# Patient Record
Sex: Male | Born: 1935 | Marital: Single
Health system: Southern US, Community
[De-identification: ages and names within clinical notes are randomized; demographics above are authoritative.]

---

## 2004-11-28 ENCOUNTER — Ambulatory Visit: Payer: Self-pay

## 2004-11-30 ENCOUNTER — Emergency Department: Payer: Self-pay | Admitting: Emergency Medicine

## 2005-02-17 ENCOUNTER — Ambulatory Visit: Payer: Self-pay

## 2005-07-11 ENCOUNTER — Ambulatory Visit: Payer: Self-pay | Admitting: Internal Medicine

## 2005-07-31 ENCOUNTER — Ambulatory Visit: Payer: Self-pay | Admitting: Internal Medicine

## 2006-05-21 ENCOUNTER — Ambulatory Visit: Payer: Self-pay | Admitting: Internal Medicine

## 2006-05-29 ENCOUNTER — Ambulatory Visit: Payer: Self-pay | Admitting: Cardiology

## 2006-06-03 ENCOUNTER — Other Ambulatory Visit: Payer: Self-pay

## 2006-06-03 ENCOUNTER — Inpatient Hospital Stay: Payer: Self-pay | Admitting: Cardiology

## 2006-06-04 ENCOUNTER — Other Ambulatory Visit: Payer: Self-pay

## 2007-01-06 ENCOUNTER — Ambulatory Visit: Payer: Self-pay | Admitting: Specialist

## 2007-06-03 ENCOUNTER — Inpatient Hospital Stay: Payer: Self-pay | Admitting: Internal Medicine

## 2009-06-13 ENCOUNTER — Inpatient Hospital Stay: Payer: Self-pay | Admitting: Vascular Surgery

## 2010-01-01 IMAGING — CR DG CHEST 2V
1 series · 3 of 3 positions shown · non-contrast
Comparison: none

REASON FOR EXAM: basal infitrate
COMMENTS:

[Series 1: view not recorded · 0.17mm/px · 3 of 3 slices shown]
[im 1/3]
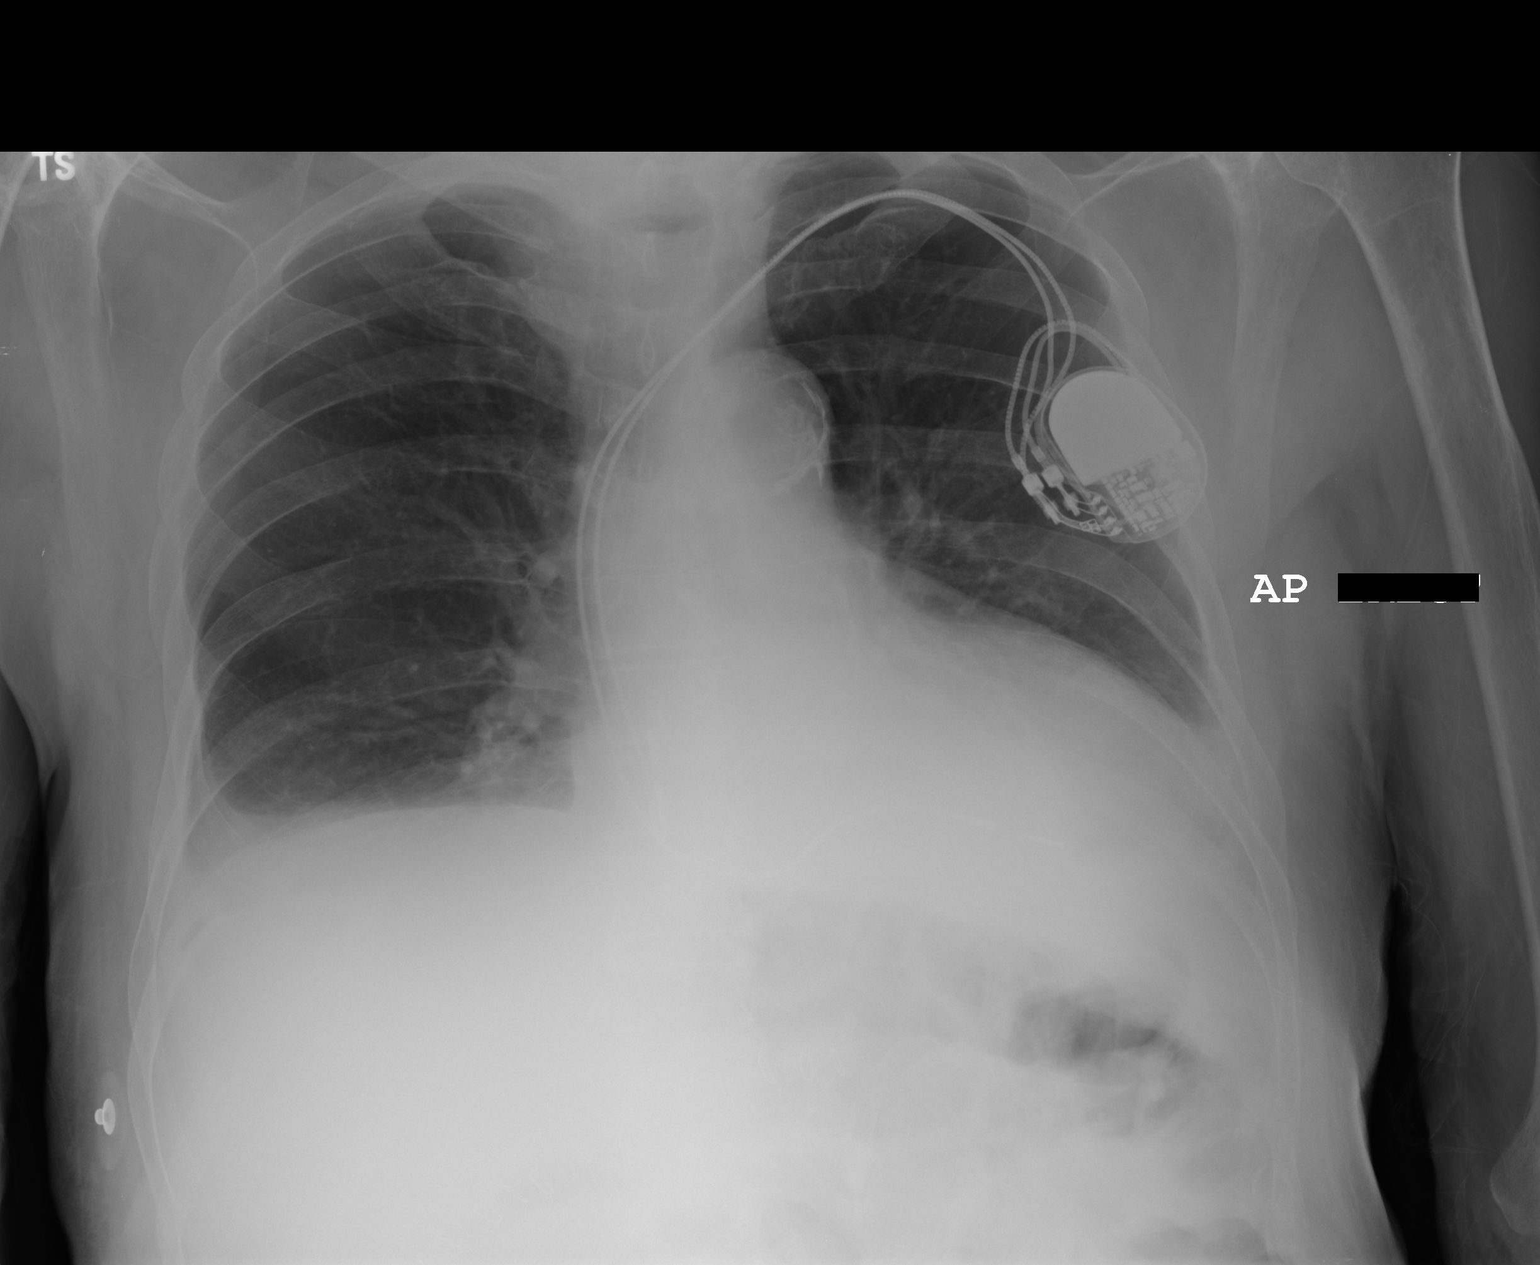
[im 2/3]
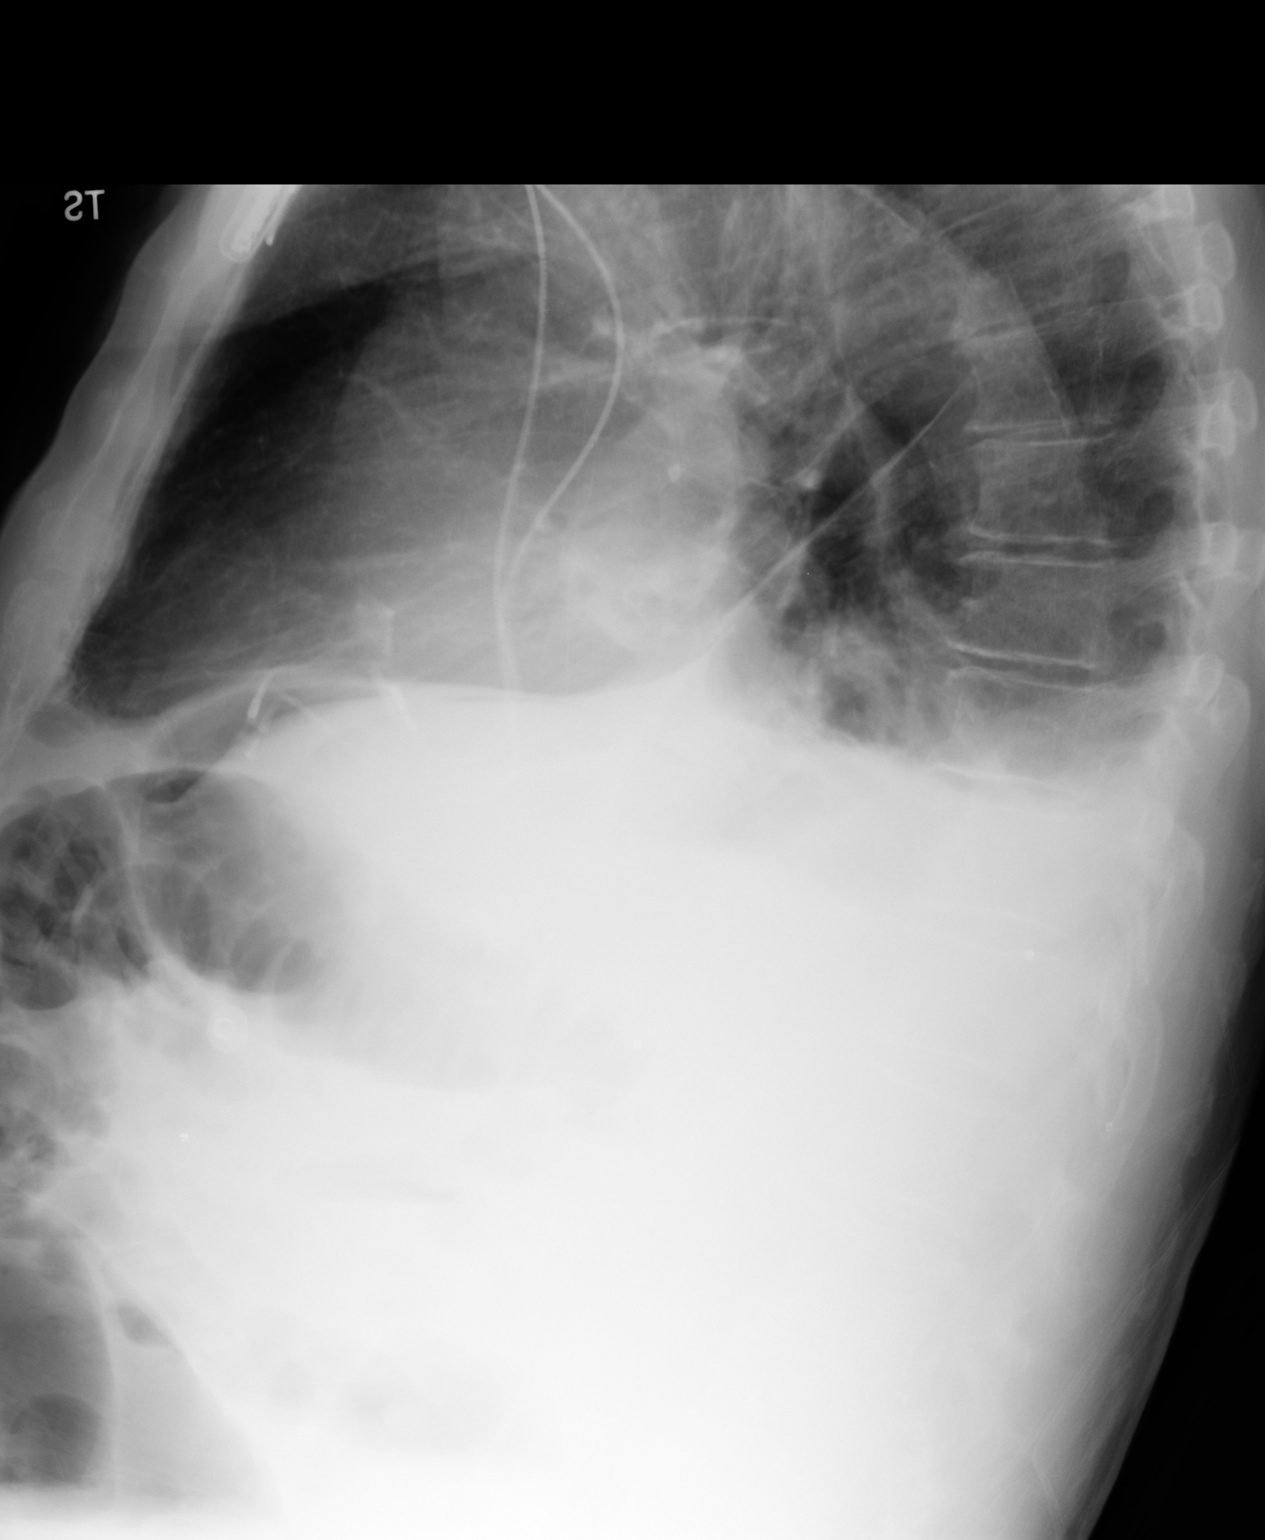
[im 3/3]
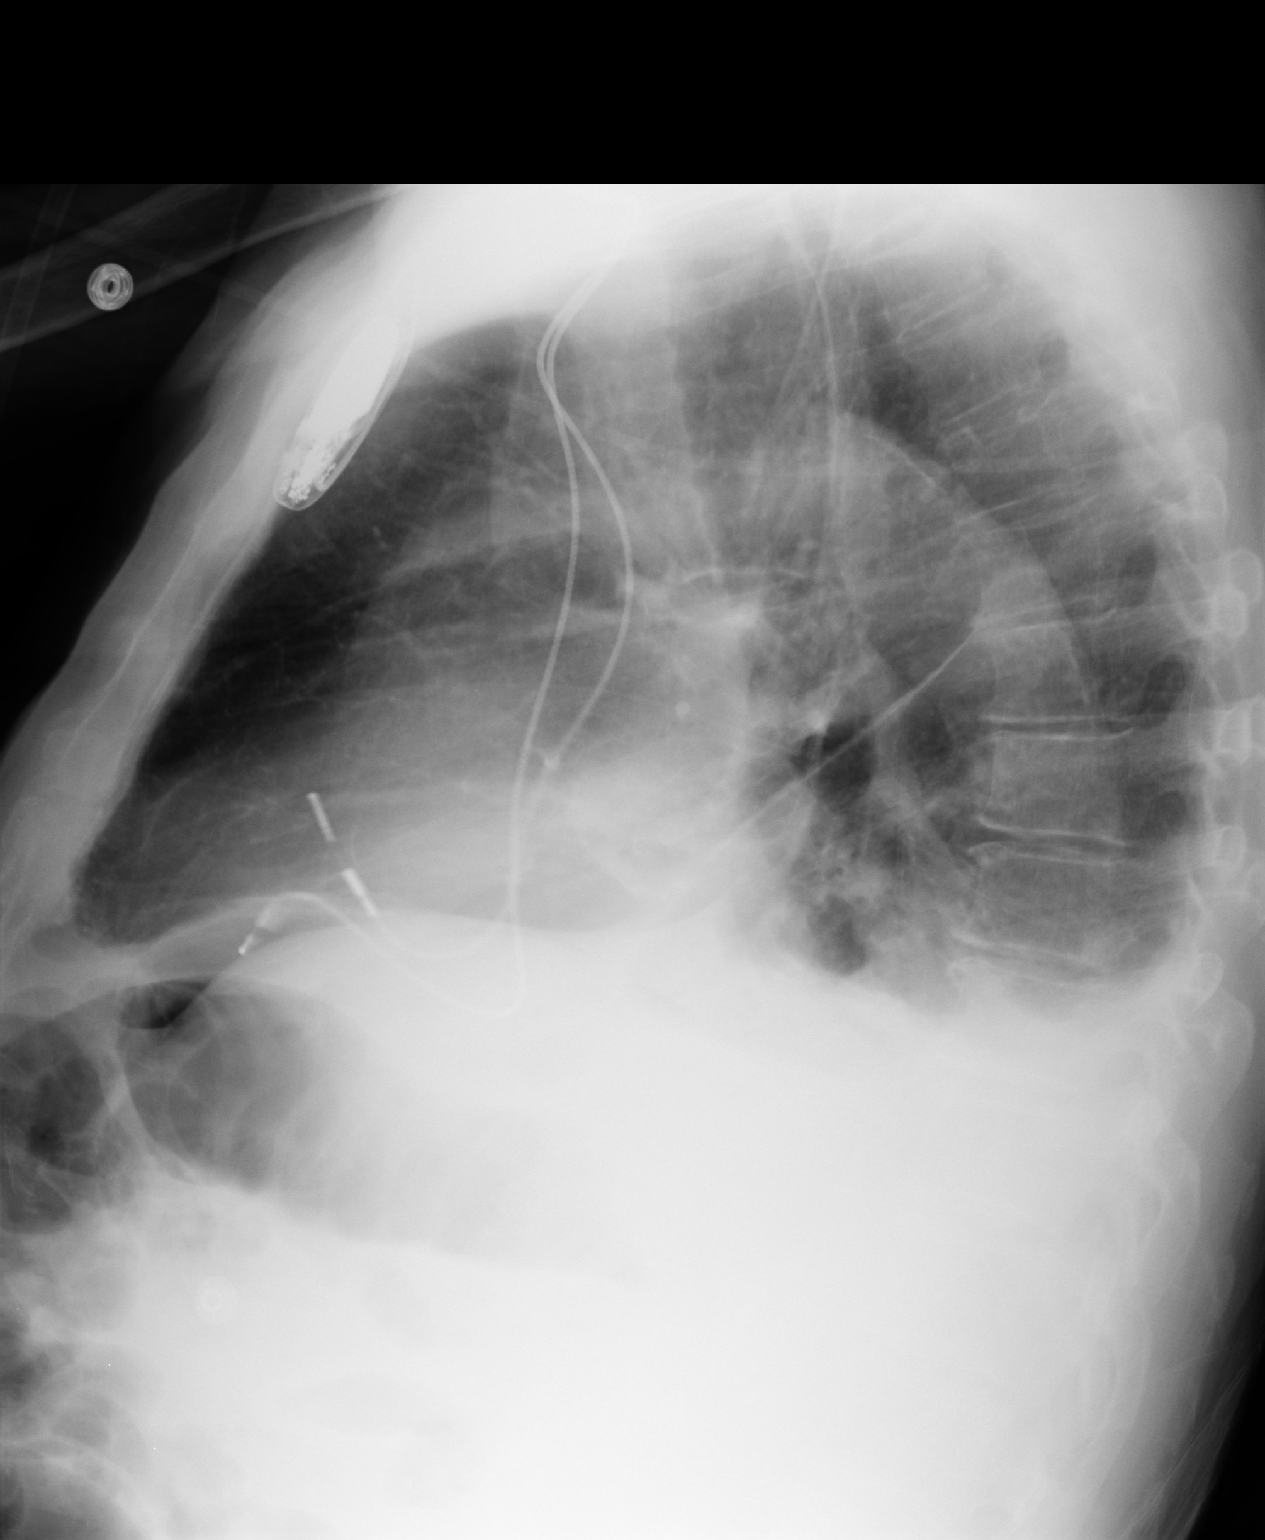

[3 of 3 positions shown; findings below may reference images not displayed]

PROCEDURE:     DXR - DXR CHEST PA (OR AP) AND LATERAL  - June 22, 2009 [DATE]

RESULT:     Comparison is made to the exam of 06/19/2009.

Small bilateral effusions are present. There is some basilar atelectasis
bilaterally. The cardiac silhouette is enlarged. A left-sided pacemaker
device is present. The bones are osteopenic. The appearance is unchanged.
IMPRESSION: 1.     Shallow inspiration with atelectasis and small effusions bilaterally.
Mild cardiomegaly. Pacemaker present.

## 2010-10-24 DEATH — deceased

## 2011-09-26 ENCOUNTER — Ambulatory Visit: Payer: Self-pay | Admitting: Internal Medicine

## 2011-10-05 ENCOUNTER — Inpatient Hospital Stay: Payer: Self-pay | Admitting: Internal Medicine

## 2011-10-05 DIAGNOSIS — I359 Nonrheumatic aortic valve disorder, unspecified: Secondary | ICD-10-CM

## 2011-10-05 LAB — COMPREHENSIVE METABOLIC PANEL
Albumin: 3.3 g/dL — ABNORMAL LOW (ref 3.4–5.0)
Anion Gap: 11 (ref 7–16)
BUN: 27 mg/dL — ABNORMAL HIGH (ref 7–18)
Calcium, Total: 9.7 mg/dL (ref 8.5–10.1)
Chloride: 104 mmol/L (ref 98–107)
Co2: 25 mmol/L (ref 21–32)
EGFR (African American): >60
EGFR (Non-African Amer.): >60
Glucose: 122 mg/dL — ABNORMAL HIGH (ref 65–99)
Potassium: 3.9 mmol/L (ref 3.5–5.1)
SGOT(AST): 435 U/L — ABNORMAL HIGH (ref 15–37)
SGPT (ALT): 48 U/L
Sodium: 140 mmol/L (ref 136–145)
Total Protein: 7.1 g/dL (ref 6.4–8.2)

## 2011-10-05 LAB — CBC
HCT: 41.2 % (ref 40.0–52.0)
HGB: 13.9 g/dL (ref 13.0–18.0)
MCH: 30.4 pg (ref 26.0–34.0)
MCHC: 33.8 g/dL (ref 32.0–36.0)
Platelet: 168 10*3/uL (ref 150–440)
RBC: 4.58 10*6/uL (ref 4.40–5.90)
RDW: 15.6 % — ABNORMAL HIGH (ref 11.5–14.5)
WBC: 8.2 10*3/uL (ref 3.8–10.6)

## 2011-10-05 LAB — CK TOTAL AND CKMB (NOT AT ARMC)
CK, Total: 1498 U/L — ABNORMAL HIGH (ref 35–232)
CK, Total: 1003 U/L — ABNORMAL HIGH (ref 35–232)
CK-MB: 114.6 ng/mL — ABNORMAL HIGH (ref 0.5–3.6)
CK-MB: 51.1 ng/mL — ABNORMAL HIGH (ref 0.5–3.6)

## 2011-10-05 LAB — URINALYSIS, COMPLETE
Glucose,UR: NEGATIVE mg/dL (ref 0–75)
Nitrite: NEGATIVE
Protein: NEGATIVE
RBC,UR: 8 /HPF (ref 0–5)
Specific Gravity: 1.019 (ref 1.003–1.030)
WBC UR: 13 /HPF (ref 0–5)

## 2011-10-05 LAB — PRO B NATRIURETIC PEPTIDE: B-Type Natriuretic Peptide: 13906 pg/mL — ABNORMAL HIGH (ref 0–450)

## 2011-10-05 LAB — TROPONIN I: Troponin-I: >40 ng/mL

## 2011-10-06 DIAGNOSIS — I214 Non-ST elevation (NSTEMI) myocardial infarction: Secondary | ICD-10-CM

## 2011-10-06 DIAGNOSIS — R748 Abnormal levels of other serum enzymes: Secondary | ICD-10-CM

## 2011-10-06 LAB — TROPONIN I: Troponin-I: >40 ng/mL

## 2011-10-06 LAB — CBC WITH DIFFERENTIAL/PLATELET
Basophil #: 0 10*3/uL (ref 0.0–0.1)
Eosinophil #: 0 10*3/uL (ref 0.0–0.7)
Lymphocyte %: 13.3 %
Monocyte %: 7 %
Neutrophil #: 6 10*3/uL (ref 1.4–6.5)
Neutrophil %: 79.1 %
Platelet: 149 10*3/uL — ABNORMAL LOW (ref 150–440)
RBC: 4.29 10*6/uL — ABNORMAL LOW (ref 4.40–5.90)
WBC: 7.6 10*3/uL (ref 3.8–10.6)

## 2011-10-06 LAB — BASIC METABOLIC PANEL
Calcium, Total: 9.6 mg/dL (ref 8.5–10.1)
Chloride: 104 mmol/L (ref 98–107)
Co2: 26 mmol/L (ref 21–32)
Glucose: 130 mg/dL — ABNORMAL HIGH (ref 65–99)
Osmolality: 284 (ref 275–301)
Potassium: 4.2 mmol/L (ref 3.5–5.1)

## 2011-10-07 LAB — URINE CULTURE

## 2011-10-10 LAB — CULTURE, BLOOD (SINGLE)

## 2011-10-24 ENCOUNTER — Ambulatory Visit: Payer: Self-pay | Admitting: Internal Medicine

## 2012-04-15 IMAGING — CR DG CHEST 1V PORT
1 series · 1 of 1 positions shown · non-contrast
Comparison: none

REASON FOR EXAM: SOB
COMMENTS:

[ap]
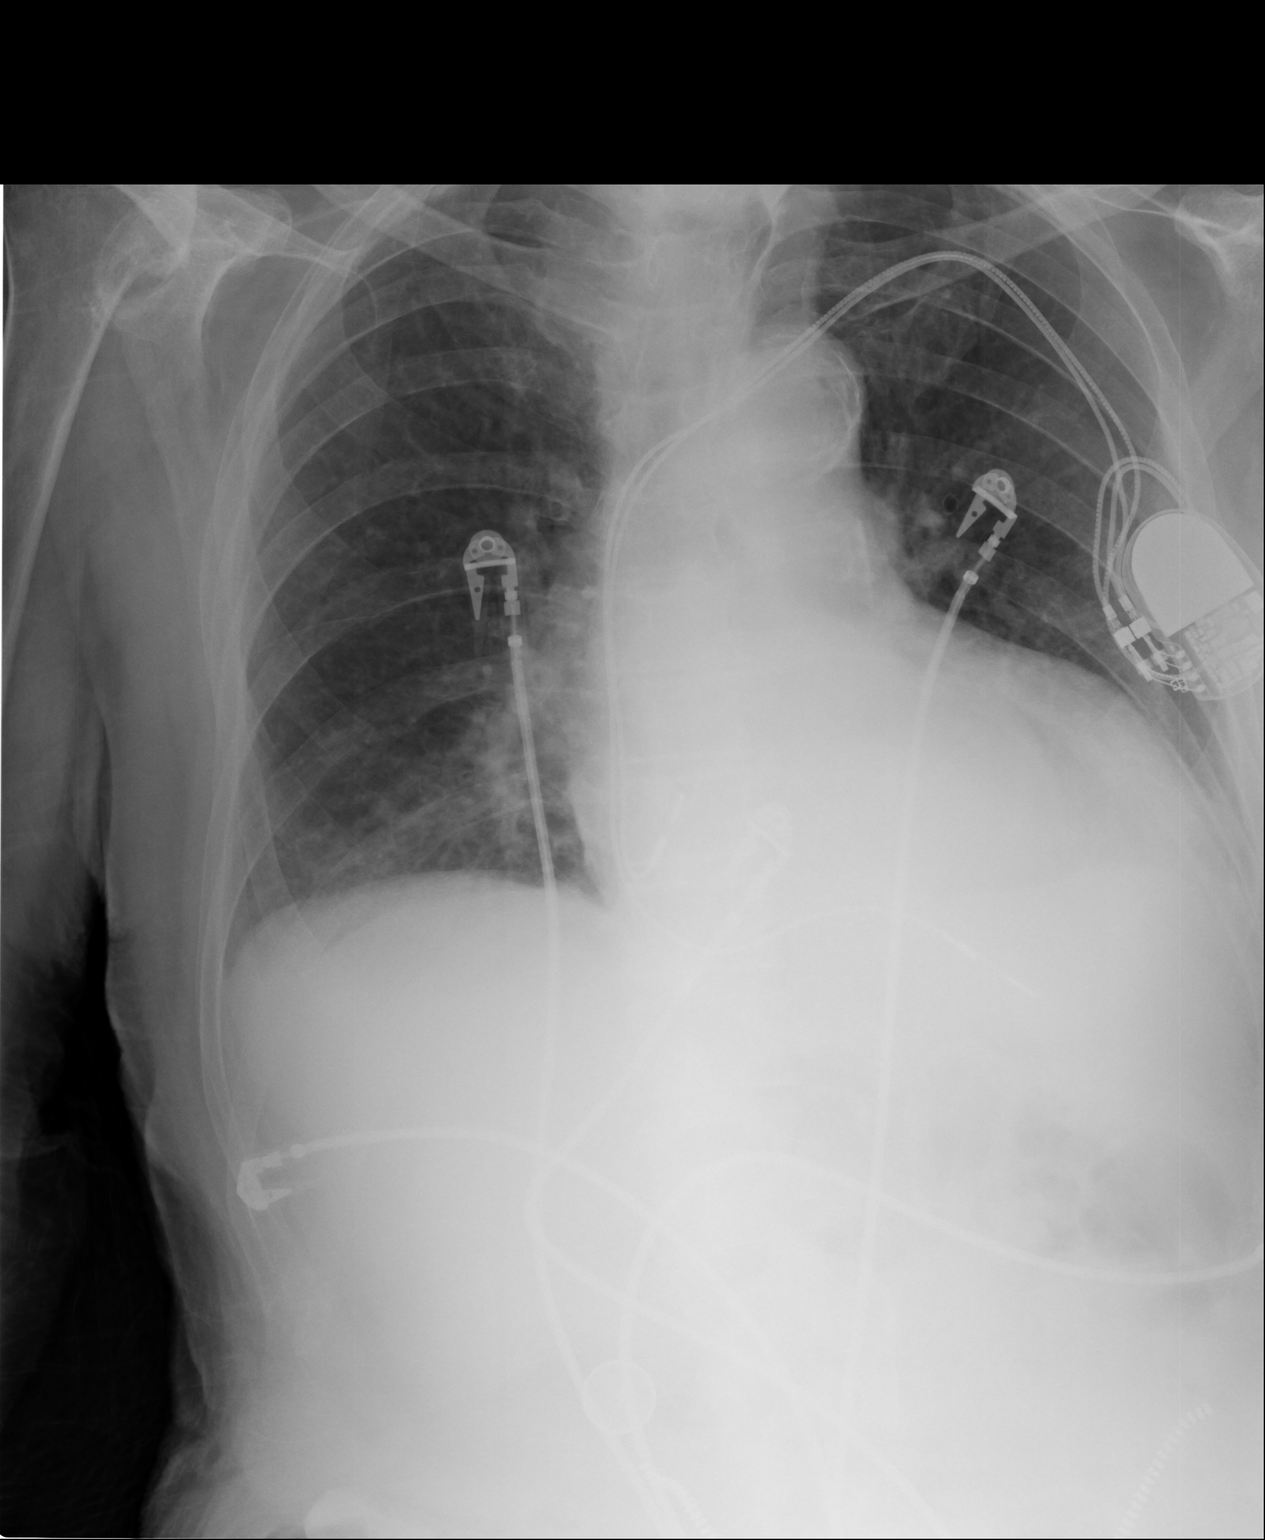

[1 of 1 positions shown; findings below may reference images not displayed]

PROCEDURE:     DXR - DXR PORTABLE CHEST SINGLE VIEW  - October 05, 2011 [DATE]

RESULT:     Comparison is made to study 22 June, 2009.

The right lung is reasonably well inflated. There are coarse lung markings
at the right lung base. On the left the hemidiaphragm is chronically
obscured. The cardiac silhouette is mildly enlarged. The pulmonary
vascularity does not appear engorged. There is a permanent pacemaker in
place.
IMPRESSION: There is minimal increased density at the right lung base.
There is chronically increased density at the left lung base. I cannot
exclude bibasilar atelectasis or developing pneumonia. A followup PA and
lateral chest x-ray would be of value.

## 2012-04-16 IMAGING — CR DG CHEST 2V
1 series · 4 of 4 positions shown · non-contrast
Comparison: none

REASON FOR EXAM: CHF
COMMENTS:

PROCEDURE:     DXR - DXR CHEST PA (OR AP) AND LATERAL  - October 06, 2011 [DATE]
RESULT:     Comparison is made to prior study dated 10/05/2011.

[Series 1: x chest ap · 0.14mm/px · 4 of 4 slices shown]
[im 1/4]
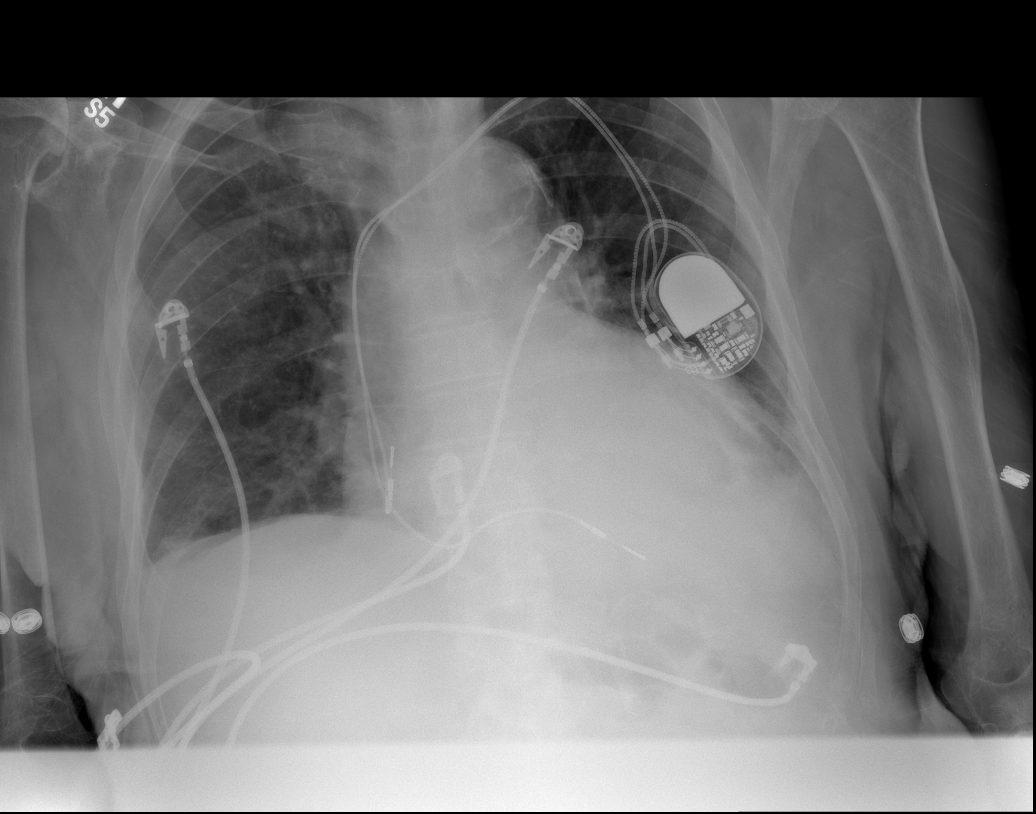
[im 2/4]
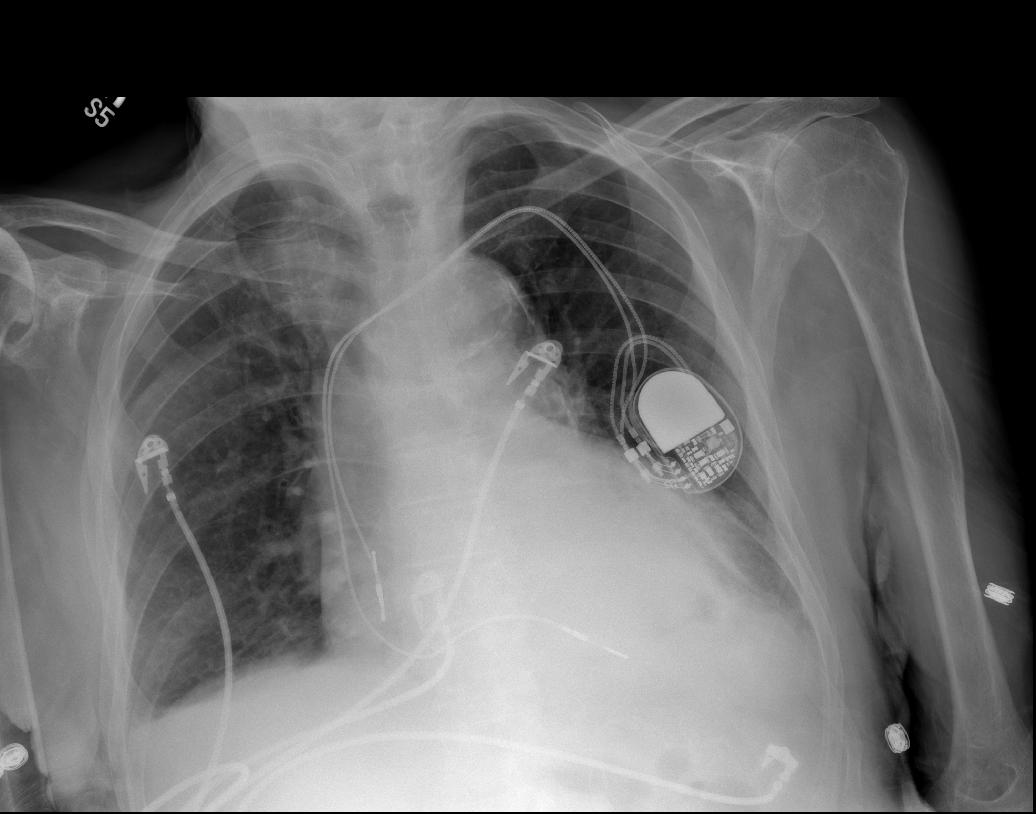
[im 3/4]
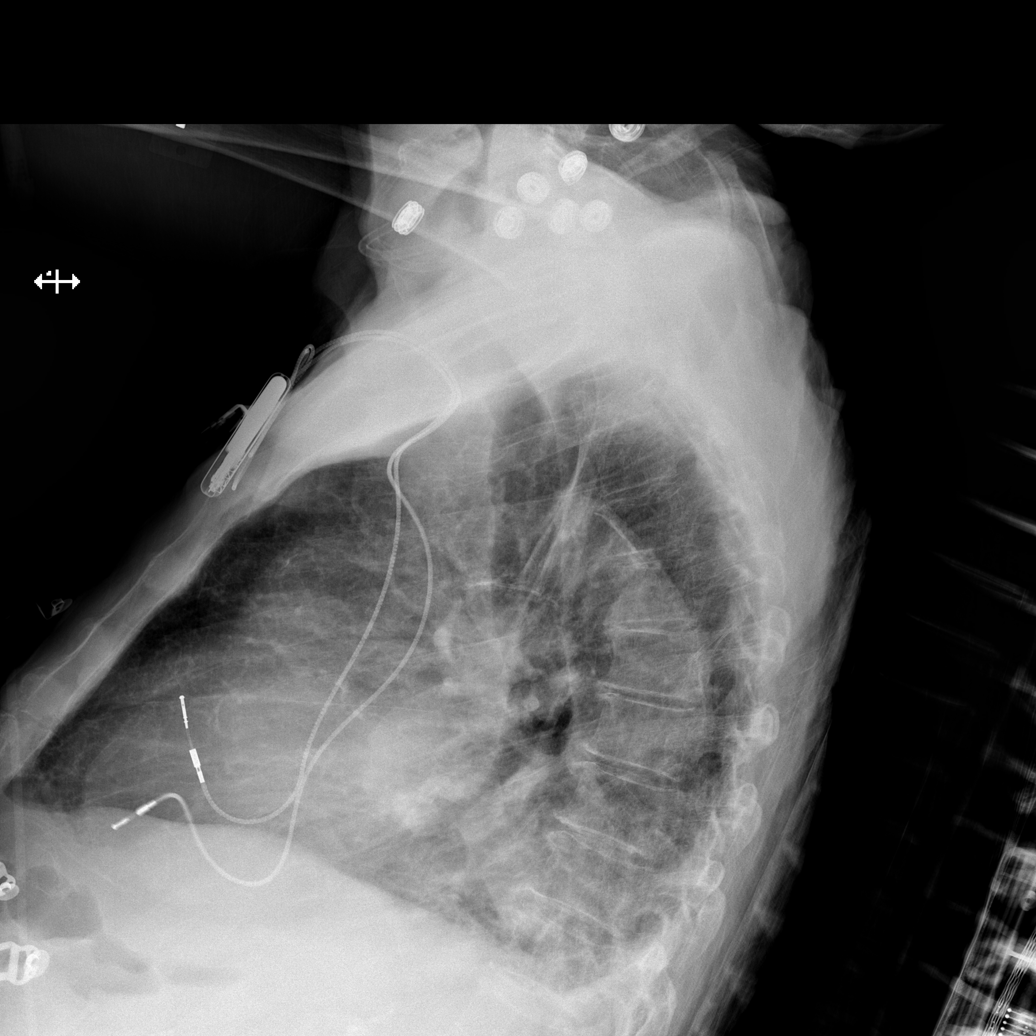
[im 4/4]
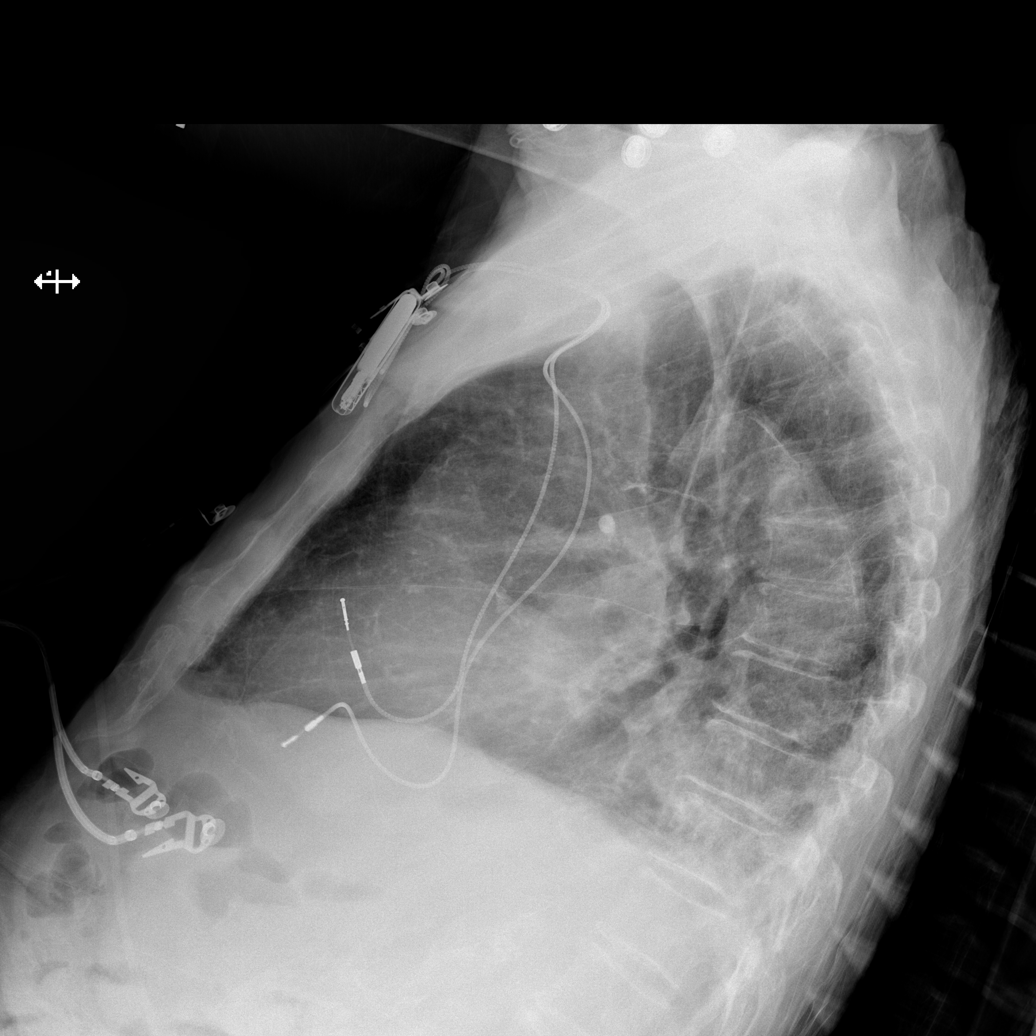

[4 of 4 positions shown; findings below may reference images not displayed]

FINDINGS: The cardiac silhouette is enlarged indicative of cardiomegaly. No
focal regions of consolidation are appreciated. There is blunting of the
right costophrenic angle. The patient is rotated and there is increased
density along the right paratracheal region. This may represent an enlarged
thyroid though a mass or infiltrate cannot be excluded and repeat PA and
lateral views is recommended. If this finding persists, Chest CT is
recommended. There is blunting of the left costophrenic angle. The area of
increased density within the right lung base is not clearly identified on
the present study, a component of which may be secondary to technique. A
left-sided pectoralis pacing unit is appreciated with lead tips projecting
in the right atrium and right ventricle.
IMPRESSION: 1. Density right upper lobe as described above.
2. Shallow inspiration. Repeat evaluation recommended
3. Blunting of the costophrenic angles which may represent scarring or
possibly small effusions.

## 2014-12-17 NOTE — Consult Note (Signed)
General Aspect 81 year old male who resides at [facility] followed  with a history of multiple medical problems including coronary artery disease status post previous MI, Alzheimer's dementia, hyperlipidemia, diabetes, and benign hypertension, pacemaker for sick sinus syndrome, sleep apnea, presents to the Emergency Room with shortness of breath, weakness, and cough. Cardiology was consulted for NSTEMI, very elevated cardiac enz.   History is challenging as patient is a poor historian. Family in the room reports acute onset of malaise, SOB starting in the past two days, maybe three days.   He was noted to have low-grade fever in the Emergency Room. Chest x-ray is suggestive of heart failure as well as pneumonia. His CK-MB  markedly elevated consistent with a non-ST elevation MI. Possible underlying dementia. Family, patient and nursing deny he has had any chest pain.   Notes indicate old MI. No recent admission for cardiac issues (at [facility]).   PAST MEDICAL HISTORY:  1. Atherosclerotic cardiovascular disease status post MI.  2. Status post pacemaker implant for unknown reasons.  3. Alzheimer's dementia.  4. Benign hypertension.  5. Hyperlipidemia.  6. Type II diabetes mellitus.  7. Sleep apnea.  8. History of GI bleed.  9. Benign prostatic hypertrophy.   MEDICATIONS:  1. Zocor 40 mg p.o. daily.  2. Lasix 20 mg p.o. b.i.d.  3. Coreg 3.125 mg p.o. b.i.d.  4. Multivitamin 1 p.o. daily.  5. Zestril 10 mg p.o. daily.  6. K-Dur 20 mEq p.o. daily.  7. Aricept 10 mg p.o. daily.  8. Lanoxin 0.125 mg p.o. daily.  9. Aspirin 81 mg p.o. daily.  10. Omeprazole 20 mg p.o. daily.   ALLERGIES: Codeine.   SOCIAL HISTORY: Apparently negative for alcohol or tobacco abuse.   FAMILY HISTORY: Positive for coronary artery disease and diabetes.   Physical Exam:   GEN WD, WN, NAD, thin    HEENT red conjunctivae    NECK supple    RESP wheezing  rhonchi  crackles    CARD Regular rate and  rhythm  Murmur    Murmur Systolic    Systolic Murmur Out flow    ABD soft    LYMPH negative neck    EXTR negative edema    SKIN normal to palpation    NEURO cranial nerves intact, motor/sensory function intact    PSYCH alert, A+O to time, place, person, good insight        Admit Diagnosis:   ACUTE REPSIRATORY FAILURE: 06-Oct-2011, Active, ACUTE REPSIRATORY FAILURE      Admit Reason:   Pneumonia: (486) Active, ICD9, PNEUMONIA, ORGANISM NOS   Acute respiratory failure: (518.81) Active, ICD9, Acute respiratory failure   CHF (congestive heart failure): (428.0) Active, ICD9, Congestive heart failure, unspecified   AMI (acute myocardial infarction): (410.90) Active, ICD9, Acute myocardial infarction, unspecified site, episode of care unspecified  Home Medications: Medication Instructions Status  Norco 5 mg-325 mg oral tablet  1-2 tab every 6 hours as needed  for pain  Active  potassium chloride 20 mEq oral tablet, extended release 0.5   once a day (in the evening) (one half tab) Active  Carvedilol 6.25 mg 1/2 tablet in the morning and evening  Active  Ramipril 5 mg one tablet daily NOON Active  Digoxin 0.125 mg one tablet daily   once a day NOON Active  Simvastatin 40 mg daily   once a day (at bedtime)  Active  Lasix tablet 20 mg 1 tab(s) orally 2 times a day  Active  potassium chloride  tablet, extended release 20 mEq 1 tab(s) orally once a day (in the morning)  Active  multi vits.daily  Active  omeprazole 20 mg daily   once a day (before a meal) in the morning Active  e.c. aspirin  81 mg  daily  Active   Radiology Results: XRay:    10-Feb-13 12:12, Chest Portable Single View   Chest Portable Single View    REASON FOR EXAM:    SOB  COMMENTS:       PROCEDURE: DXR - DXR PORTABLE CHEST SINGLE VIEW  - Oct 05 2011 12:12PM     RESULT: Comparison is made to study of 22 June 2009.    The right lung is reasonably well inflated. There are coarse lung   markings at the  right lung base. On the left the hemidiaphragm is   chronically obscured. The cardiac silhouette is mildly enlarged. The   pulmonary vascularity does not appear engorged. There is a permanent   pacemaker in place.    IMPRESSION:  There is minimal increased density at the right lung base.   There is chronically increased density at the left lung base. I cannot     exclude bibasilar atelectasis or developing pneumonia. A followup PA and   lateral chest x-ray would be of value.          Verified By: DAVID A. SwazilandJORDAN, M.D., MD    11-Feb-13 11:01, Chest PA and Lateral   Chest PA and Lateral    REASON FOR EXAM:    CHF  COMMENTS:       PROCEDURE: DXR - DXR CHEST PA (OR AP) AND LATERAL  - Oct 06 2011 11:01AM     RESULT: Comparison is made to prior study dated 10/05/2011.    Findings: The cardiac silhouette is enlarged indicative of cardiomegaly.   No focal regions of consolidation are appreciated. There is blunting of   the right costophrenic angle. The patient is rotated and there is   increased density along the right paratracheal region. This may represent   an enlarged thyroid though amass or infiltrate cannot be excluded and   repeat PA and lateral views is recommended. If this finding persists,   Chest CT is recommended. There is blunting of the left costophrenic   angle. The area of increased density within the right lung base is not     clearly identified on the present study, a component of which may be   secondary to technique. A left-sided pectoralis pacing unit is   appreciated with lead tips projecting in the right atrium and right   ventricle.    IMPRESSION:      1. Density right upper lobe as described above.  2. Shallow inspiration. Repeat evaluation recommended  3. Blunting of the costophrenic angles which may represent scarring or   possibly small effusions.    Thank you for the opportunity to contribute to the care of your patient.         Verified By: Jani FilesHECTOR  W. COOPER, M.D., MD  Cardiology:    10-Feb-13 17:10, Echo Doppler   Echo Doppler    Interpretation Summary    The left ventricle is mildly dilated. Left ventricular systolic   function is mild to moderately reduced. EF estimated at 40%. The   transmitral spectral Doppler flow pattern is suggestive of impaired   LV relaxation. There is moderate apical wall hypokinesis. There is   moderate septal hypokinesis. Can not rule out other regions of wall  motion abnormality. There is a pacemaker lead in the right   ventricle. The right ventricular systolic function is normal. The   left atrium is moderately dilated. There is mild to moderate mitral   regurgitation. Right ventricular systolic pressure is elevated at   30-73mmHg. At least moderate aortic valve sclerosis. No   hemodynamically significant valvular aortic stenosis.    PatientHeight: 168 cm    PatientWeight: 61 kg    BSA: 1.7 m2    Procedure:    A two-dimensional transthoracic echocardiogram with color flow and   Doppler was performed.    The study was completed  bedside.    Left Ventricle    The transmitral spectral Doppler flow pattern is suggestive of   impaired LV relaxation.    There is moderate septal hypokinesis.    There is moderate apical wall hypokinesis.    The left ventricle is mildly dilated.    There is mild concentric left ventricular hypertrophy.    Left ventricular systolic function is mild to moderately reduced.    EF estimated at 40%.    Can not rule out other regions of wall motion abnormality.    Right Ventricle    The right ventricle is normal size.    There is a pacemaker lead in the right ventricle.    The right ventricular systolic function is normal.    Atria    The left atrium is moderately dilated.    Right atrial size is normal.    There is no Doppler evidence for an atrial septal defect.    Mitral Valve    The mitral valve leaflets appear normal. There is no evidence of   stenosis,  fluttering, or prolapse.    There is no mitral valve stenosis.    There is mild to moderate mitral regurgitation.    Tricuspid Valve    There is mild tricuspid regurgitation.    Right ventricular systolic pressure is elevated at 30-15mmHg.    The tricuspid valve is normal.    Aortic Valve    Mild aortic regurgitation.    No hemodynamically significant valvular aortic stenosis.    At least moderate aortic valve sclerosis.    Pulmonic Valve    The pulmonic valve is not well visualized.    There is no pulmonic valvular regurgitation.    Great Vessels    The aortic root is normal size.    The pulmonary is not well visualized.    MMode 2D Measurements and Calculations    IVSd: 1.5 cm    LVIDd: 5.9 cm    LVIDs: 3.9 cm    LVPWd: 1.5 cm    FS: 34 %    EF(Teich): 61 %    Ao root diam: 3.6 cm    ACS: 1.0 cm    LVOT diam: 2.5 cm    LVLd ap4: 6.4 cm    EDV(MOD-sp4): 73 ml    LVLs ap4: 6.1 cm    ESV(MOD-sp4): 56 ml    EF(MOD-sp4): 23 %    SV(MOD-sp4): 17 ml    Doppler Measurements and Calculations    MV E point: 55 cm/sec    MV A point: 73 cm/sec    MV E/A: 0.75     MV P1/2t max vel: 48 cm/sec    MV P1/2t: 115 msec    MVA(P1/2t): 1.9 cm2    MV dec slope: 122 cm/sec2    MV dec time: 0.35 sec    Ao V2 max: 197 cm/sec  Ao max PG: 14 mmHg    Ao V2 mean: 116 cm/sec    Ao mean PG: 7.2 mmHg    Ao V2 VTI: 34 cm    AVA(I,D): 2.1 cm2    AVA(V,D): 2.1 cm2    AI max vel: 328 cm/sec    AI max PG: 43 mmHg    AI dec slope: 328 cm/sec2    AI P1/2t: 293 msec    LV max PG: 3.0 mmHg    LV mean PG: 1.2 mmHg    LV V1 max: 85 cm/sec    LV V1 mean: 50 cm/sec    LV V1 VTI: 15 cm    MR max vel: 633 cm/sec    MR max PG: 160 mmHg    SV(LVOT): 73 ml    PI end-d vel: 66 cm/sec    TR Max vel: 251 cm/sec    TR Max PG: 25 mmHg    RVSP: 35 mmHg    RAP systole: 10 mmHg    Reading Physician: Julien Nordmann   Sonographer: Andi Hence  Interpreting Physician:  Julien Nordmann,   electronically signed on   10-06-2011 08:16:34  Requesting Physician: Julien Nordmann    Codeine: Other  Vital Signs/Nurse's Notes: **Vital Signs.:   11-Feb-13 13:30   Temperature Temperature (F) 97.5   Celsius 36.3   Temperature Source axillary   Pulse Pulse 66   Pulse source per Dinamap   Respirations Respirations 22   Systolic BP Systolic BP 78   Diastolic BP (mmHg) Diastolic BP (mmHg) 41   Mean BP 53   BP Source Dinamap   Pulse Ox % Pulse Ox % 95   Pulse Ox Activity Level  At rest   Oxygen Delivery 2L     Impression 81 year old male who resides at [facility] followed  with a history of multiple medical problems including coronary artery disease status post previous MI, Alzheimer's dementia, hyperlipidemia, diabetes, and benign hypertension, pacemaker for sick sinus syndrome, sleep apnea, presents to the Emergency Room with shortness of breath, weakness, and cough.   Electronic Signatures: Julien Nordmann (MD)  (Signed 11-Feb-13 17:49)  Authored: General Aspect/Present Illness, History and Physical Exam, Health Issues, Home Medications, Radiology, Allergies, Vital Signs/Nurse's Notes, Impression/Plan   Last Updated: 11-Feb-13 17:49 by Julien Nordmann (MD)

## 2014-12-17 NOTE — H&P (Signed)
PATIENT NAME:  Roberto Anthony, Roberto Anthony MR#:  [id number] DATE OF BIRTH:  11/23/1916  DATE OF ADMISSION:  10/05/2011  REFERRING PHYSICIAN: Dr. Ladona Ridgel    FAMILY PHYSICIAN: Dr. Dewaine Oats    REASON FOR ADMISSION: Acute respiratory failure with congestive heart failure, pneumonia, and presumed non-ST elevation MI.   HISTORY OF PRESENT ILLNESS: The patient is a 81 year old male who resides at [facility] followed by Dr. Arlana Pouch with a history of multiple medical problems including coronary artery disease status post previous MI, Alzheimer's dementia, hyperlipidemia, diabetes, and benign hypertension, also sleep apnea. He has a pacemaker. He presents to the Emergency Room with shortness of breath, weakness, and cough. He was noted to have low-grade fever in the Emergency Room. Chest x-ray is suggestive of heart failure as well as pneumonia. His CK-MB is markedly elevated consistent with a non-ST elevation MI. He is now admitted for further evaluation. The patient is unable to give a history because of his underlying dementia.   PAST MEDICAL HISTORY:  1. Atherosclerotic cardiovascular disease status post MI.  2. Status post pacemaker implant for unknown reasons.  3. Alzheimer's dementia.  4. Benign hypertension.  5. Hyperlipidemia.  6. Type II diabetes mellitus.  7. Sleep apnea.  8. History of GI bleed.  9. Benign prostatic hypertrophy.   MEDICATIONS:  1. Zocor 40 mg p.o. daily.  2. Lasix 20 mg p.o. b.i.d.  3. Coreg 3.125 mg p.o. b.i.d.  4. Multivitamin 1 p.o. daily.  5. Zestril 10 mg p.o. daily.  6. K-Dur 20 mEq p.o. daily.  7. Aricept 10 mg p.o. daily.  8. Lanoxin 0.125 mg p.o. daily.  9. Aspirin 81 mg p.o. daily.  10. Omeprazole 20 mg p.o. daily.   ALLERGIES: Codeine.   SOCIAL HISTORY: Apparently negative for alcohol or tobacco abuse.   FAMILY HISTORY: Positive for coronary artery disease and diabetes.   REVIEW OF SYSTEMS: Unable to obtain from the patient.   PHYSICAL EXAMINATION:    GENERAL: The patient is acutely ill appearing with audible breath sounds. He is confused.   VITAL SIGNS: Blood pressure 250/60 with a heart rate of 91 and a respiratory rate of 28. Temperature is 99.   HEENT: Normocephalic, atraumatic. Pupils equally round and reactive to light and accommodation. Extraocular movements are intact. Sclerae nonicteric. Conjunctivae are clear. Oropharynx is dry but clear.   NECK: Supple without JVD. No adenopathy or thyromegaly is noted.   LUNGS: Scattered rhonchi throughout with basilar rales.   CARDIAC: Regular rate and rhythm with normal S1 and S2. No significant rubs, murmurs, or gallops.   ABDOMEN: Soft, nontender with normoactive bowel sounds. No organomegaly or masses were appreciated. No hernias or bruits were noted.   EXTREMITIES: Without clubbing, cyanosis, or edema. Pulses were 1+ bilaterally.   SKIN: Warm and dry without rash or lesions.   NEUROLOGIC: Cranial nerves II through XII grossly intact. Deep tendon reflexes are symmetric. Motor and sensory exams nonfocal.   PSYCH: The patient was disoriented to place and time, although he did know his name.   LABORATORY DATA: EKG revealed sinus rhythm with a right bundle branch block and left ventricular hypertrophy. Chest x-ray revealed right lower lobe infiltrate as well as pulmonary edema. CBC revealed a white count of 8.2 with a hemoglobin of 13.9. Glucose was 122 with a BUN of 27 and a creatinine of 1.11 with a sodium of 140 and potassium 3.9. His ALT was 48 and his AST was 435. BNP was 13,906. CK-MB was 114.6.  ASSESSMENT:  1. Acute respiratory failure.  2. Acute systolic congestive heart failure.  3. Non-ST elevation MI.  4. Right lower lobe pneumonia.  5. Dehydration.  6. Alzheimer's dementia.  7. Benign hypertension.  8. Diet controlled diabetes.   PLAN:  1. The patient will be admitted to the floor with telemetry as a NO CODE BLUE, DO NOT RESUSCITATE according to his son's direction  who is power-of-attorney.  2. He will be started on oxygen with Xopenex and Atrovent SVNs and IV antibiotics for his pneumonia.  3. Will follow up a chest x-ray in the morning and wean oxygen as tolerated.  4. Will begin IV Lasix for his congestive heart failure and follow his potassium closely.  5. In regards to his non-ST elevation MI, will follow serial cardiac enzymes and obtain an echocardiogram.   6. Will consult Cardiology.  7. Will begin topical nitrates and Lovenox.  8. Continue aspirin, Coreg, and lisinopril for now.  9. Given the patient's age and multiple problems, his prognosis is poor.  10. Further treatment and evaluation will depend upon the patient's progress.      TOTAL TIME SPENT ON THIS PATIENT: 50 minutes.   ____________________________ Duane LopeJeffrey D. Judithann SheenSparks, MD jds:drc D: 10/05/2011 14:40:19 ET T: 10/05/2011 14:55:33 ET JOB#: [id number]  cc: Duane LopeJeffrey D. Judithann SheenSparks, MD, <Dictator> Jillene Bucksenny C. Arlana Pouchate, MD Alexandr Oehler Rodena Medin Saumya Hukill MD ELECTRONICALLY SIGNED 10/05/2011 15:04

## 2014-12-17 NOTE — Discharge Summary (Signed)
PATIENT NAME:  Meribeth MattesWARD, Amol A MR#:  [id number] DATE OF BIRTH:  [birth year]  DATE OF ADMISSION:  10/05/2011 DATE OF DISCHARGE:  10/07/2011  TYPE OF DISCHARGE: The patient is being discharged to the Hospice Home   DISCHARGE DIAGNOSES:  1. Pneumonia. 2. Non-ST-elevation myocardial infarction. 3. Coronary artery disease.  CONSULTATIONS:  1. Cardiology consultation  2. Palliative Care consultation   HOSPITAL COURSE: The patient is a 81 year old male with multiple medical problems of coronary artery disease, diabetes, hypertension, hyperlipidemia, history of pacemaker, history of GI bleed, and benign prostatic hypertrophy who was admitted on the 10th because of trouble breathing, cough, and fever. The patient was also found to have non-ST-elevation MI. He was admitted to telemetry and started on IV Zosyn along with oxygen and nebulizers. The patient's blood cultures have been negative. Admission white count was 8.2, hemoglobin 13.9, hematocrit 41.2, platelet count 168. The patient also had elevated troponins which were more than 40, CK total 1,003, and CPK-MB 51.1. The patient was started on aspirin, beta-blockers, and nitrates. The patient's second troponin also was up to 40. Echocardiogram showed EF of 40% with hypokinesis, moderate apical and septal hypokinesis. Dr. Mariah MillingGollan saw the patient because of age and multiple risk factors. He is not a candidate for invasive surgery. Dr. Dareen PianoAnderson also requested the patient be placed in Hospice as [facility] After consult with Dr. Harvie JuniorPhifer who saw the patient and made referral to Hospice home. He will be going to Hospice home today because of his multiple medical problems, advanced age, and family is requesting comfort care only. The patient will be going to Hospice home when they have a bed.   TIME SPENT ON DISCHARGE PREPARATION: More than 30 minutes.   CODE STATUS: DO NOT RESUSCITATE.   Discussed the plan with the patient's daughter at  bedside this morning in detail and also with Dr. Harvie JuniorPhifer and Dr. Julien Nordmannimothy Gollan.   ____________________________ Katha HammingSnehalatha Toryn Dewalt, MD sk:drc D: 10/07/2011 12:43:00 ET T: 10/07/2011 13:27:56 ET JOB#: [id number]  cc: Katha HammingSnehalatha Catharine Kettlewell, MD, <Dictator> Katha HammingSNEHALATHA Blakley Michna MD ELECTRONICALLY SIGNED 10/13/2011 16:21
